# Patient Record
Sex: Female | Born: 1995 | Hispanic: Yes | Smoking: Current every day smoker
Health system: Southern US, Community
[De-identification: ages and names within clinical notes are randomized; demographics above are authoritative.]

---

## 2020-07-31 ENCOUNTER — Other Ambulatory Visit: Payer: Self-pay

## 2020-07-31 ENCOUNTER — Encounter: Payer: Self-pay | Admitting: Physician Assistant

## 2020-07-31 ENCOUNTER — Emergency Department
Admission: EM | Admit: 2020-07-31 | Discharge: 2020-07-31 | Disposition: A | Discharge status: Home / Self Care | Payer: Medicaid Other | Attending: Emergency Medicine | Admitting: Emergency Medicine

## 2020-07-31 DIAGNOSIS — M79674 Pain in right toe(s): Secondary | ICD-10-CM | POA: Diagnosis present

## 2020-07-31 DIAGNOSIS — L600 Ingrowing nail: Secondary | ICD-10-CM | POA: Insufficient documentation

## 2020-07-31 DIAGNOSIS — L089 Local infection of the skin and subcutaneous tissue, unspecified: Secondary | ICD-10-CM | POA: Diagnosis not present

## 2020-07-31 MED ORDER — CEPHALEXIN 500 MG PO CAPS: 500.0000 mg | ORAL_CAPSULE | Freq: Three times a day (TID) | ORAL | 0 refills | Status: DC

## 2020-07-31 NOTE — ED Triage Notes (Signed)
Pt comes pov with ingrown toenail with some redness and pain on right big toe.

## 2020-07-31 NOTE — Discharge Instructions (Addendum)
Soak your foot in warm salt water 3 times daily.  Return if worsening.  Take the antibiotic as prescribed.  Follow-up with podiatry.  Phone number is attached

## 2020-07-31 NOTE — ED Provider Notes (Signed)
[facility] Provider Note  ____________________________________________   Event Date/Time   First MD Initiated Contact with Patient 07/31/20 1556     (approximate)  I have reviewed the triage vital signs and the nursing notes.   HISTORY  Chief Complaint Toe Pain    HPI Maria [facility] is a 25 y.o. female presents emergency department complaining of right great toenail pain and infection.  States she tried to remove a portion of an ingrown toenail and the area has become very red and swollen.  No fever or chills.    History reviewed. No pertinent past medical history.  There are no problems to display for this patient.   History reviewed. No pertinent surgical history.  Prior to Admission medications   Medication Sig Start Date End Date Taking? Authorizing Provider  cephALEXin (KEFLEX) 500 MG capsule Take 1 capsule (500 mg total) by mouth 3 (three) times daily. 07/31/20  Yes Faythe Ghee, PA-C    Allergies Patient has no known allergies.  History reviewed. No pertinent family history.  Social History    Review of Systems  Constitutional: No fever/chills Eyes: No visual changes. ENT: No sore throat. Respiratory: Denies cough Cardiovascular: Denies chest pain Gastrointestinal: Denies abdominal pain Genitourinary: Negative for dysuria. Musculoskeletal: Negative for back pain.  Positive for right great toe pain Skin: Negative for rash. Psychiatric: no mood changes,     ____________________________________________   PHYSICAL EXAM:  VITAL SIGNS: ED Triage Vitals [07/31/20 1559]  Enc Vitals Group     BP 105/64     Pulse Rate 81     Resp 18     Temp 98.2 F (36.8 C)     Temp Source Oral     SpO2 98 %     Weight 170 lb (77.1 kg)     Height 5\' 3"  (1.6 m)     Head Circumference      Peak Flow      Pain Score 8     Pain Loc      Pain Edu?      Excl. in [place]?     Constitutional: Alert and oriented. Well  appearing and in no acute distress. Eyes: Conjunctivae are normal.  Head: Atraumatic. Nose: No congestion/rhinnorhea. Mouth/Throat: Mucous membranes are moist.   Neck:  supple no lymphadenopathy noted Cardiovascular: Normal rate, regular rhythm.  Respiratory: Normal respiratory effort.  No retractions GU: deferred Musculoskeletal: FROM all extremities, warm and well perfused, right great toe has a area of abraded skin, toe is tender and swollen, no obvious drainage noted Neurologic:  Normal speech and language.  Skin:  Skin is warm, dry  No rash noted. Psychiatric: Mood and affect are normal. Speech and behavior are normal.  ____________________________________________   LABS (all labs ordered are listed, but only abnormal results are displayed)  Labs Reviewed - No data to display ____________________________________________   ____________________________________________  RADIOLOGY    ____________________________________________   PROCEDURES  Procedure(s) performed: No  Procedures    ____________________________________________   INITIAL IMPRESSION / ASSESSMENT AND PLAN / ED COURSE  Pertinent labs & imaging results that were available during my care of the patient were reviewed by me and considered in my medical decision making (see chart for details).   Patient presents with ingrown toenail.  See HPI.  Physical exam shows area to be infected.  Explained to the patient we do not remove ingrown toenails here in the ED.  We gave her an antibiotic, explained how to  do warm water soaks, and follow-up with podiatry.  She was discharged in stable condition.     [facility] was evaluated in Emergency Department on 07/31/2020 for the symptoms described in the history of present illness. She was evaluated in the context of the global COVID-19 pandemic, which necessitated consideration that the patient might be at risk for infection with the SARS-CoV-2 virus that causes  COVID-19. Institutional protocols and algorithms that pertain to the evaluation of patients at risk for COVID-19 are in a state of rapid change based on information released by regulatory bodies including the CDC and federal and state organizations. These policies and algorithms were followed during the patient's care in the ED.    As part of my medical decision making, I reviewed the following data within the electronic MEDICAL RECORD NUMBER Nursing notes reviewed and incorporated, Old chart reviewed, Notes from prior ED visits and  Controlled Substance Database  ____________________________________________   FINAL CLINICAL IMPRESSION(S) / ED DIAGNOSES  Final diagnoses:  Ingrown toenail of right foot with infection      NEW MEDICATIONS STARTED DURING THIS VISIT:  New Prescriptions   CEPHALEXIN (KEFLEX) 500 MG CAPSULE    Take 1 capsule (500 mg total) by mouth 3 (three) times daily.     Note:  This document was prepared using Dragon voice recognition software and may include unintentional dictation errors.    Faythe Ghee, PA-C 07/31/20 1619    Phineas Semen, MD 07/31/20 [phone]

## 2020-07-31 NOTE — ED Notes (Signed)
See triage note. Pt denies fever. R foot's great toe has skin cut away medially as well as portions of the nail. Pt reports history of ingrown toe nails and states couldn't achieve pain relief after cutting the nail this time. States usually it relieves the pain. Toe red but not obviously swollen. Pt can move toe.

## 2020-12-21 ENCOUNTER — Ambulatory Visit (LOCAL_COMMUNITY_HEALTH_CENTER): Payer: Self-pay

## 2020-12-21 ENCOUNTER — Other Ambulatory Visit: Payer: Self-pay

## 2020-12-21 DIAGNOSIS — Z111 Encounter for screening for respiratory tuberculosis: Secondary | ICD-10-CM

## 2020-12-24 ENCOUNTER — Other Ambulatory Visit: Payer: Self-pay

## 2020-12-24 ENCOUNTER — Ambulatory Visit (LOCAL_COMMUNITY_HEALTH_CENTER): Payer: Medicaid Other

## 2020-12-24 ENCOUNTER — Other Ambulatory Visit: Payer: Medicaid Other

## 2020-12-24 DIAGNOSIS — Z111 Encounter for screening for respiratory tuberculosis: Secondary | ICD-10-CM

## 2020-12-24 LAB — TB SKIN TEST
Induration: 0 mm
TB Skin Test: NEGATIVE

## 2021-02-11 ENCOUNTER — Emergency Department: Payer: Medicaid Other

## 2021-02-11 ENCOUNTER — Emergency Department
Admission: EM | Admit: 2021-02-11 | Discharge: 2021-02-11 | Disposition: A | Discharge status: Home / Self Care | Payer: Medicaid Other | Attending: Emergency Medicine | Admitting: Emergency Medicine

## 2021-02-11 DIAGNOSIS — R059 Cough, unspecified: Secondary | ICD-10-CM | POA: Insufficient documentation

## 2021-02-11 DIAGNOSIS — J029 Acute pharyngitis, unspecified: Secondary | ICD-10-CM | POA: Insufficient documentation

## 2021-02-11 DIAGNOSIS — Z20822 Contact with and (suspected) exposure to covid-19: Secondary | ICD-10-CM | POA: Insufficient documentation

## 2021-02-11 DIAGNOSIS — R051 Acute cough: Secondary | ICD-10-CM

## 2021-02-11 LAB — RESP PANEL BY RT-PCR (FLU A&B, COVID) ARPGX2
Influenza A by PCR: NEGATIVE
Influenza B by PCR: NEGATIVE
SARS Coronavirus 2 by RT PCR: NEGATIVE

## 2021-02-11 LAB — GROUP A STREP BY PCR: Group A Strep by PCR: NOT DETECTED

## 2021-02-11 LAB — HCG, QUANTITATIVE, PREGNANCY: hCG, Beta Chain, Quant, S: 274 m[IU]/mL — ABNORMAL HIGH (ref <5)

## 2021-02-11 MED ORDER — DEXAMETHASONE 6 MG PO TABS
10.0000 mg | ORAL_TABLET | Freq: Once | ORAL | Status: AC
  Administered 2021-02-11: 10 mg via ORAL
  Filled 2021-02-11: qty 1

## 2021-02-11 MED ORDER — ALBUTEROL SULFATE HFA 108 (90 BASE) MCG/ACT IN AERS
2.0000 | INHALATION_SPRAY | Freq: Four times a day (QID) | RESPIRATORY_TRACT | 2 refills | Status: DC | PRN
Start: 2021-02-11 — End: 2021-02-11

## 2021-02-11 MED ORDER — ALBUTEROL SULFATE HFA 108 (90 BASE) MCG/ACT IN AERS: 2.0000 | INHALATION_SPRAY | Freq: Four times a day (QID) | RESPIRATORY_TRACT | 2 refills | Status: AC | PRN

## 2021-02-11 NOTE — ED Provider Notes (Signed)
Emergency Medicine Provider Triage Evaluation Note  Maria Terry , a 25 y.o. female  was evaluated in triage.  Pt complains of complains of URI symptoms, cough and body aches, no fever.  Patient also took a abortion pill last week.  States she felt like it got stuck in her throat and is concerned that may have caused her symptoms.  Review of Systems  Positive: Cough, body aches, sore Negative: Fever chills chest pain shortness of breath  Physical Exam  BP 104/77 (BP Location: Left Arm)   Pulse 92   Temp 98.3 F (36.8 C) (Oral)   Resp 18   Ht 5\' 3"  (1.6 m)   Wt 81.6 kg   SpO2 98%   BMI 31.89 kg/m  Gen:   Awake, no distress   Resp:  Normal effort  MSK:   Moves extremities without difficulty  Other:    Medical Decision Making  Medically screening exam initiated at 9:17 AM.  Appropriate orders placed.  [facility] was informed that the remainder of the evaluation will be completed by another provider, this initial triage assessment does not replace that evaluation, and the importance of remaining in the ED until their evaluation is complete.  Labs and imaging ordered.  We will also do beta-hCG to to patient's recent miscarriage.   Angelique Holm, PA-C 02/11/21 02/13/21    [id number], MD 02/11/21 [phone]

## 2021-02-11 NOTE — ED Triage Notes (Signed)
Pt here with a cough and a sore throat since Sunday. Pt states the sore throat began yesterday and it is painful to swallow. Pt in NAD in triage.

## 2021-02-11 NOTE — ED Notes (Signed)
Dc instructions and scripts reviewed with pt no questions or concerns at this time. Will follow up as needed. Ambulated to lobby without difficulty.

## 2021-02-11 NOTE — ED Provider Notes (Signed)
[facility] Emergency Department Provider Note    Event Date/Time   First MD Initiated Contact with Patient 02/11/21 1103     (approximate)  I have reviewed the triage vital signs and the nursing notes.   HISTORY  Chief Complaint Cough and Sore Throat    HPI Maria Terry is a 25 y.o. female history of vaping and recent medical termination of pregnancy presents to the ER for cough congestion sore throat last 12 to 24 hours.  No nausea or vomiting.  States she does have allergies and feels a little bit different.  Has had some congestion as well.  Took a home COVID test that was negative.  She denies any pelvic pain or discharge.  She has follow-up with OB next week for repeat hCG level.  States she otherwise feels well.  No past medical history on file. No family history on file. No past surgical history on file. There are no problems to display for this patient.     Prior to Admission medications   Medication Sig Start Date End Date Taking? Authorizing Provider  albuterol (VENTOLIN HFA) 108 (90 Base) MCG/ACT inhaler Inhale 2 puffs into the lungs every 6 (six) hours as needed for wheezing or shortness of breath. 02/11/21  Yes Willy Eddy, MD  cephALEXin (KEFLEX) 500 MG capsule Take 1 capsule (500 mg total) by mouth 3 (three) times daily. Patient not taking: Reported on 12/21/2020 07/31/20   Faythe Ghee, PA-C    Allergies Patient has no known allergies.    Social History    Review of Systems Patient denies headaches, rhinorrhea, blurry vision, numbness, shortness of breath, chest pain, edema, cough, abdominal pain, nausea, vomiting, diarrhea, dysuria, fevers, rashes or hallucinations unless otherwise stated above in HPI. ____________________________________________   PHYSICAL EXAM:  VITAL SIGNS: Vitals:   02/11/21 0915  BP: 104/77  Pulse: 92  Resp: 18  Temp: 98.3 F (36.8 C)  SpO2: 98%    Constitutional: Alert and oriented.   Eyes: Conjunctivae are normal.  Head: Atraumatic. Nose: No congestion/rhinnorhea. Mouth/Throat: Mucous membranes are moist.  Bilateral tonsillar erythema uvula is midline.  No exudates.  No stridor. Neck: No stridor. Painless ROM.  Cardiovascular: Normal rate, regular rhythm. Grossly normal heart sounds.  Good peripheral circulation. Respiratory: Normal respiratory effort.  No retractions. Lungs CTAB. Gastrointestinal: Soft and nontender. No distention. No abdominal bruits. No CVA tenderness. Genitourinary:  Musculoskeletal: No lower extremity tenderness nor edema.  No joint effusions. Neurologic:  Normal speech and language. No gross focal neurologic deficits are appreciated. No facial droop Skin:  Skin is warm, dry and intact. No rash noted. Psychiatric: Mood and affect are normal. Speech and behavior are normal.  ____________________________________________   LABS (all labs ordered are listed, but only abnormal results are displayed)  Results for orders placed or performed during the hospital encounter of 02/11/21 (from the past 24 hour(s))  Resp Panel by RT-PCR (Flu A&B, Covid) Nasopharyngeal Swab     Status: None   Collection Time: 02/11/21  9:20 AM   Specimen: Nasopharyngeal Swab; Nasopharyngeal(NP) swabs in vial transport medium  Result Value Ref Range   SARS Coronavirus 2 by RT PCR NEGATIVE NEGATIVE   Influenza A by PCR NEGATIVE NEGATIVE   Influenza B by PCR NEGATIVE NEGATIVE  hCG, quantitative, pregnancy     Status: Abnormal   Collection Time: 02/11/21  9:20 AM  Result Value Ref Range   hCG, Beta Chain, Quant, S 274 (H) <5 mIU/mL   ____________________________________________  ____________________________________________  RADIOLOGY  I personally reviewed all radiographic images ordered to evaluate for the above acute complaints and reviewed radiology reports and findings.  These findings were personally discussed with the patient.  Please see medical record for  radiology report.  ____________________________________________   PROCEDURES  Procedure(s) performed:  Procedures    Critical Care performed: no ____________________________________________   INITIAL IMPRESSION / ASSESSMENT AND PLAN / ED COURSE  Pertinent labs & imaging results that were available during my care of the patient were reviewed by me and considered in my medical decision making (see chart for details).   DDX: uri, strep, coid, rsv, pna, ptx  Maria [facility] is a 25 y.o. who presents to the ED with presentation as described above.  Patient clinically very well-appearing no acute distress.  Has a very bronchitic sounding cough with some coarse breath sounds no sign of pneumonia or pneumothorax.  Is not consistent with PE or cardiac etiology.  hCG ordered at triage is elevated but likely downtrending given recent medical AB.  She does not have any symptoms to suggest a failed termination of pregnancy at this time no signs or symptoms suggest ectopic.  She does appear clinically stable and appropriate for outpatient follow-up with OB.  Will be given albuterol as well as Decadron.  Will test for strep.  Does appear appropriate for outpatient follow-up.     The patient was evaluated in Emergency Department today for the symptoms described in the history of present illness. He/she was evaluated in the context of the global COVID-19 pandemic, which necessitated consideration that the patient might be at risk for infection with the SARS-CoV-2 virus that causes COVID-19. Institutional protocols and algorithms that pertain to the evaluation of patients at risk for COVID-19 are in a state of rapid change based on information released by regulatory bodies including the CDC and federal and state organizations. These policies and algorithms were followed during the patient's care in the ED.  As part of my medical decision making, I reviewed the following data within the electronic medical  record:  Nursing notes reviewed and incorporated, Labs reviewed, notes from prior ED visits and [place] Controlled Substance Database   ____________________________________________   FINAL CLINICAL IMPRESSION(S) / ED DIAGNOSES  Final diagnoses:  Sore throat  Acute cough      NEW MEDICATIONS STARTED DURING THIS VISIT:  New Prescriptions   ALBUTEROL (VENTOLIN HFA) 108 (90 BASE) MCG/ACT INHALER    Inhale 2 puffs into the lungs every 6 (six) hours as needed for wheezing or shortness of breath.     Note:  This document was prepared using Dragon voice recognition software and may include unintentional dictation errors.    Willy Eddy, MD 02/11/21 [phone]

## 2021-05-05 ENCOUNTER — Other Ambulatory Visit: Payer: Self-pay

## 2021-05-05 ENCOUNTER — Emergency Department: Payer: Medicaid Other

## 2021-05-05 ENCOUNTER — Encounter: Payer: Self-pay | Admitting: *Deleted

## 2021-05-05 DIAGNOSIS — R059 Cough, unspecified: Secondary | ICD-10-CM | POA: Diagnosis not present

## 2021-05-05 DIAGNOSIS — R509 Fever, unspecified: Secondary | ICD-10-CM | POA: Diagnosis not present

## 2021-05-05 DIAGNOSIS — R5381 Other malaise: Secondary | ICD-10-CM | POA: Insufficient documentation

## 2021-05-05 DIAGNOSIS — Z20822 Contact with and (suspected) exposure to covid-19: Secondary | ICD-10-CM | POA: Insufficient documentation

## 2021-05-05 DIAGNOSIS — R000 Tachycardia, unspecified: Secondary | ICD-10-CM | POA: Diagnosis not present

## 2021-05-05 DIAGNOSIS — R0602 Shortness of breath: Secondary | ICD-10-CM | POA: Insufficient documentation

## 2021-05-05 DIAGNOSIS — R0789 Other chest pain: Secondary | ICD-10-CM | POA: Insufficient documentation

## 2021-05-05 LAB — BASIC METABOLIC PANEL
Anion gap: 6 (ref 5–15)
BUN: 12 mg/dL (ref 6–20)
CO2: 20 mmol/L — ABNORMAL LOW (ref 22–32)
Calcium: 8.9 mg/dL (ref 8.9–10.3)
Chloride: 108 mmol/L (ref 98–111)
Creatinine, Ser: 0.5 mg/dL (ref 0.44–1.00)
GFR, Estimated: >60 mL/min (ref >60)
Glucose, Bld: 127 mg/dL — ABNORMAL HIGH (ref 70–99)
Potassium: 3.4 mmol/L — ABNORMAL LOW (ref 3.5–5.1)
Sodium: 134 mmol/L — ABNORMAL LOW (ref 135–145)

## 2021-05-05 LAB — CBC
HCT: 42.1 % (ref 36.0–46.0)
Hemoglobin: 13.9 g/dL (ref 12.0–15.0)
MCH: 25.5 pg — ABNORMAL LOW (ref 26.0–34.0)
MCHC: 33 g/dL (ref 30.0–36.0)
MCV: 77.1 fL — ABNORMAL LOW (ref 80.0–100.0)
Platelets: 233 10*3/uL (ref 150–400)
RBC: 5.46 MIL/uL — ABNORMAL HIGH (ref 3.87–5.11)
RDW: 14.1 % (ref 11.5–15.5)
WBC: 8.7 10*3/uL (ref 4.0–10.5)
nRBC: 0 % (ref 0.0–0.2)

## 2021-05-05 LAB — TROPONIN I (HIGH SENSITIVITY): Troponin I (High Sensitivity): <2 ng/L (ref <18)

## 2021-05-05 NOTE — ED Triage Notes (Signed)
Pt reports sob x 1 hour.  Pt also has chest pain radiating into the back   pt alert  speech clear. Cig smoker.

## 2021-05-06 ENCOUNTER — Emergency Department
Admission: EM | Admit: 2021-05-06 | Discharge: 2021-05-06 | Disposition: A | Discharge status: Home / Self Care | Payer: Medicaid Other | Attending: Emergency Medicine | Admitting: Emergency Medicine

## 2021-05-06 DIAGNOSIS — B349 Viral infection, unspecified: Secondary | ICD-10-CM

## 2021-05-06 DIAGNOSIS — R0600 Dyspnea, unspecified: Secondary | ICD-10-CM

## 2021-05-06 DIAGNOSIS — R509 Fever, unspecified: Secondary | ICD-10-CM

## 2021-05-06 LAB — HEPATIC FUNCTION PANEL
ALT: 15 U/L (ref 0–44)
AST: 14 U/L — ABNORMAL LOW (ref 15–41)
Albumin: 4 g/dL (ref 3.5–5.0)
Alkaline Phosphatase: 87 U/L (ref 38–126)
Bilirubin, Direct: 0.1 mg/dL (ref 0.0–0.2)
Indirect Bilirubin: 0.7 mg/dL (ref 0.3–0.9)
Total Bilirubin: 0.8 mg/dL (ref 0.3–1.2)
Total Protein: 7.4 g/dL (ref 6.5–8.1)

## 2021-05-06 LAB — RESP PANEL BY RT-PCR (FLU A&B, COVID) ARPGX2
Influenza A by PCR: NEGATIVE
Influenza B by PCR: NEGATIVE
SARS Coronavirus 2 by RT PCR: NEGATIVE

## 2021-05-06 LAB — PROTIME-INR
INR: 1 (ref 0.8–1.2)
Prothrombin Time: 13.4 seconds (ref 11.4–15.2)

## 2021-05-06 LAB — LACTIC ACID, PLASMA: Lactic Acid, Venous: 1 mmol/L (ref 0.5–1.9)

## 2021-05-06 LAB — D-DIMER, QUANTITATIVE: D-Dimer, Quant: 0.32 ug/mL-FEU (ref 0.00–0.50)

## 2021-05-06 LAB — TROPONIN I (HIGH SENSITIVITY): Troponin I (High Sensitivity): <2 ng/L (ref <18)

## 2021-05-06 MED ORDER — ONDANSETRON 4 MG PO TBDP
4.0000 mg | ORAL_TABLET | Freq: Three times a day (TID) | ORAL | 0 refills | Status: AC | PRN
Start: 2021-05-06 — End: ?

## 2021-05-06 MED ORDER — LACTATED RINGERS IV BOLUS
1000.0000 mL | Freq: Once | INTRAVENOUS | Status: AC
  Administered 2021-05-06: 1000 mL via INTRAVENOUS

## 2021-05-06 NOTE — ED Provider Notes (Signed)
°  Patient received in signout from Dr. Karma Greaser pending remainder of blood work and IV fluids for evaluation of shortness of breath and possible sepsis.  Work-up returns quite benign without leukocytosis, lactic acidosis or signs of sepsis.  D-dimer is negative and PE is less likely.  2 troponins are negative and patient reports feeling much better after IV fluids on my reassessment.  Resolution of tachycardia after IV fluids as well.  I provided a prescription for Zofran to use as needed at home, we discussed OTC antipyretics and return precautions for the ED.  Patient suitable for outpatient management.   Vladimir Crofts, MD 05/06/21 [phone]

## 2021-05-06 NOTE — ED Provider Notes (Signed)
[facility] Provider Note    Event Date/Time   First MD Initiated Contact with Patient 05/06/21 [phone]     (approximate)   History   Shortness of Breath   HPI  [facility] is a 26 y.o. female who reports no chronic medical issues and presents for evaluation of less than 12 hours of shortness of breath, mild cough, general malaise, and some chest pressure.  Symptoms were acute in onset and she has not felt them before.  She did not realize she was febrile until coming to the emergency department.  No nausea or vomiting.  Good oral intake recently.  No history of blood clots in her legs or her lungs.  No history of heart disease.  She denies nasal congestion, sore throat, neck pain, neck stiffness.  Some mild headache.  No abdominal pain, no dysuria.     Physical Exam   Triage Vital Signs: ED Triage Vitals  Enc Vitals Group     BP 05/05/21 2136 118/79     Pulse Rate 05/05/21 2136 (!) 122     Resp 05/05/21 2136 20     Temp 05/05/21 2136 (!) 100.6 F (38.1 C)     Temp Source 05/05/21 2136 Oral     SpO2 05/05/21 2136 94 %     Weight 05/05/21 2136 81.6 kg (180 lb)     Height 05/05/21 2136 1.6 m (5\' 3" )     Head Circumference --      Peak Flow --      Pain Score 05/05/21 2143 10     Pain Loc --      Pain Edu? --      Excl. in [place]? --     Most recent vital signs: Vitals:   05/05/21 2136 05/06/21 0032  BP: 118/79 134/76  Pulse: (!) 122 (!) 124  Resp: 20 18  Temp: (!) 100.6 F (38.1 C) 98.8 F (37.1 C)  SpO2: 94% 95%     General: Awake, no distress but appears uncomfortable. CV:  Good peripheral perfusion.  Normal capillary refill.  Tachycardia with heart rate in the 120s. Resp:  Normal effort.  No tachypnea.  No wheezes, rales, nor rhonchi upon auscultation. Abd:  No distention.  No tenderness to palpation.   ED Results / Procedures / Treatments   Labs (all labs ordered are listed, but only abnormal results are displayed) Labs Reviewed   BASIC METABOLIC PANEL - Abnormal; Notable for the following components:      Result Value   Sodium 134 (*)    Potassium 3.4 (*)    CO2 20 (*)    Glucose, Bld 127 (*)    All other components within normal limits  CBC - Abnormal; Notable for the following components:   RBC 5.46 (*)    MCV 77.1 (*)    MCH 25.5 (*)    All other components within normal limits  RESP PANEL BY RT-PCR (FLU A&B, COVID) ARPGX2  CULTURE, BLOOD (ROUTINE X 2)  CULTURE, BLOOD (ROUTINE X 2)  URINE CULTURE  LACTIC ACID, PLASMA  LACTIC ACID, PLASMA  PROTIME-INR  URINALYSIS, COMPLETE (UACMP) WITH MICROSCOPIC  HEPATIC FUNCTION PANEL  POC URINE PREG, ED  POC URINE PREG, ED  TROPONIN I (HIGH SENSITIVITY)  TROPONIN I (HIGH SENSITIVITY)     EKG  ED ECG REPORT I, Hinda Kehr, the attending physician, personally viewed and interpreted this ECG.  Date: 05/05/2021 EKG Time: 21: 40 Rate: 122 Rhythm: Sinus tachycardia QRS Axis:  normal Intervals: normal ST/T Wave abnormalities: Non-specific ST segment / T-wave changes, but no clear evidence of acute ischemia. Narrative Interpretation: no definitive evidence of acute ischemia; does not meet STEMI criteria.    RADIOLOGY I personally reviewed the patient's chest x-ray and see no evidence of lobar pneumonia.  Radiology confirms this impression.    PROCEDURES:  Critical Care performed: No  .1-3 Lead EKG Interpretation Performed by: Hinda Kehr, MD Authorized by: Hinda Kehr, MD     Interpretation: abnormal     ECG rate:  120   ECG rate assessment: tachycardic     Rhythm: sinus tachycardia     Ectopy: none     Conduction: normal     MEDICATIONS ORDERED IN ED: Medications  lactated ringers bolus 1,000 mL (has no administration in time range)     IMPRESSION / MDM / ASSESSMENT AND PLAN / ED COURSE  I reviewed the triage vital signs and the nursing notes.                              Differential diagnosis includes, but is not limited to,  respiratory viral illness including COVID-19 and influenza, may require pneumonia, PE, ACS, other nonspecific infectious source.  The patient is on the cardiac monitor to evaluate for evidence of arrhythmia and/or significant heart rate changes.  Vital signs are notable for persistent tachycardia in the 120s.  She was also febrile at 100.6 upon arrival.  Labs ordered when the patient was triaged include basic metabolic panel and CBC.  The CBC is within normal limits with no leukocytosis and basic metabolic panel was reassuring with no clinically significant electrolyte or renal abnormalities.  However the patient is febrile and tachycardic.  This likely is a viral illness rather than a bacterial infection but I will initiate "possible sepsis" work-up and I ordered lactic acid, respiratory viral panel, blood cultures, and hepatic function panel.  I also ordered 1 L LR fluid bolus to assess for improvements in heart rate.  I reviewed the chest x-ray and see no evidence of lobar pneumonia.  Radiologist agrees with plan.  Patient understands and agrees with the plan.  We will reassess after additional work-up and fluids to assess for improvement.  Clinical Course as of 05/06/21 0805  Fri May 06, 2021  0734 Transferring ED care to Dr. Vladimir Crofts to reassess after lab results, fluids. [CF]    Clinical Course User Index [CF] Hinda Kehr, MD     FINAL CLINICAL IMPRESSION(S) / ED DIAGNOSES   Final diagnoses:  Dyspnea, unspecified type  Fever, unspecified fever cause     Rx / DC Orders   ED Discharge Orders     None        Note:  This document was prepared using Dragon voice recognition software and may include unintentional dictation errors.   Hinda Kehr, MD 05/06/21 [phone]

## 2021-05-06 NOTE — ED Notes (Signed)
EDP at bedside  

## 2021-05-06 NOTE — Discharge Instructions (Signed)
Please take Tylenol and ibuprofen/Advil for your pain.  It is safe to take them together, or to alternate them every few hours.  Take up to 1000mg  of Tylenol at a time, up to 4 times per day.  Do not take more than 4000 mg of Tylenol in 24 hours.  For ibuprofen, take 400-600 mg, 4-5 times per day.  You Zofran as needed for nausea and vomiting.

## 2021-05-06 NOTE — ED Notes (Signed)
Pt resting with RR even and unlabored. Call bell in reach. Stretcher locked in lowest position. NAD noted

## 2021-05-14 LAB — CULTURE, BLOOD (ROUTINE X 2)
Culture: NO GROWTH
Culture: NO GROWTH

## 2021-07-04 ENCOUNTER — Emergency Department
Admission: EM | Admit: 2021-07-04 | Discharge: 2021-07-04 | Disposition: A | Discharge status: Home / Self Care | Payer: Medicaid Other | Attending: Emergency Medicine | Admitting: Emergency Medicine

## 2021-07-04 ENCOUNTER — Emergency Department: Payer: Medicaid Other

## 2021-07-04 ENCOUNTER — Encounter: Payer: Self-pay | Admitting: Emergency Medicine

## 2021-07-04 ENCOUNTER — Other Ambulatory Visit: Payer: Self-pay

## 2021-07-04 DIAGNOSIS — R630 Anorexia: Secondary | ICD-10-CM | POA: Diagnosis not present

## 2021-07-04 DIAGNOSIS — R110 Nausea: Secondary | ICD-10-CM | POA: Insufficient documentation

## 2021-07-04 DIAGNOSIS — R197 Diarrhea, unspecified: Secondary | ICD-10-CM | POA: Insufficient documentation

## 2021-07-04 DIAGNOSIS — K55069 Acute infarction of intestine, part and extent unspecified: Secondary | ICD-10-CM

## 2021-07-04 DIAGNOSIS — R1031 Right lower quadrant pain: Secondary | ICD-10-CM | POA: Diagnosis not present

## 2021-07-04 LAB — COMPREHENSIVE METABOLIC PANEL
ALT: 22 U/L (ref 0–44)
AST: 18 U/L (ref 15–41)
Albumin: 4.1 g/dL (ref 3.5–5.0)
Alkaline Phosphatase: 91 U/L (ref 38–126)
Anion gap: 8 (ref 5–15)
BUN: 11 mg/dL (ref 6–20)
CO2: 25 mmol/L (ref 22–32)
Calcium: 9.3 mg/dL (ref 8.9–10.3)
Chloride: 106 mmol/L (ref 98–111)
Creatinine, Ser: 0.55 mg/dL (ref 0.44–1.00)
GFR, Estimated: >60 mL/min (ref >60)
Glucose, Bld: 93 mg/dL (ref 70–99)
Potassium: 3.6 mmol/L (ref 3.5–5.1)
Sodium: 139 mmol/L (ref 135–145)
Total Bilirubin: 0.6 mg/dL (ref 0.3–1.2)
Total Protein: 7.6 g/dL (ref 6.5–8.1)

## 2021-07-04 LAB — URINALYSIS, ROUTINE W REFLEX MICROSCOPIC
Bacteria, UA: NONE SEEN
Bilirubin Urine: NEGATIVE
Glucose, UA: NEGATIVE mg/dL
Ketones, ur: NEGATIVE mg/dL
Leukocytes,Ua: NEGATIVE
Nitrite: NEGATIVE
Protein, ur: NEGATIVE mg/dL
Specific Gravity, Urine: 1.026 (ref 1.005–1.030)
pH: 5 (ref 5.0–8.0)

## 2021-07-04 LAB — CBC
HCT: 40.4 % (ref 36.0–46.0)
Hemoglobin: 12.7 g/dL (ref 12.0–15.0)
MCH: 24.2 pg — ABNORMAL LOW (ref 26.0–34.0)
MCHC: 31.4 g/dL (ref 30.0–36.0)
MCV: 77.1 fL — ABNORMAL LOW (ref 80.0–100.0)
Platelets: 247 10*3/uL (ref 150–400)
RBC: 5.24 MIL/uL — ABNORMAL HIGH (ref 3.87–5.11)
RDW: 14.7 % (ref 11.5–15.5)
WBC: 5.6 10*3/uL (ref 4.0–10.5)
nRBC: 0 % (ref 0.0–0.2)

## 2021-07-04 LAB — LIPASE, BLOOD: Lipase: 39 U/L (ref 11–51)

## 2021-07-04 LAB — POC URINE PREG, ED: Preg Test, Ur: NEGATIVE

## 2021-07-04 MED ORDER — ONDANSETRON HCL 4 MG/2ML IJ SOLN
4.0000 mg | Freq: Once | INTRAMUSCULAR | Status: AC
  Administered 2021-07-04: 4 mg via INTRAVENOUS
  Filled 2021-07-04: qty 2

## 2021-07-04 MED ORDER — FENTANYL CITRATE PF 50 MCG/ML IJ SOSY
50.0000 ug | PREFILLED_SYRINGE | Freq: Once | INTRAMUSCULAR | Status: AC
  Administered 2021-07-04: 50 ug via INTRAVENOUS
  Filled 2021-07-04: qty 1

## 2021-07-04 MED ORDER — IOHEXOL 300 MG/ML  SOLN
100.0000 mL | Freq: Once | INTRAMUSCULAR | Status: AC | PRN
  Administered 2021-07-04: 100 mL via INTRAVENOUS
  Filled 2021-07-04: qty 100

## 2021-07-04 MED ORDER — SODIUM CHLORIDE 0.9 % IV BOLUS
1000.0000 mL | Freq: Once | INTRAVENOUS | Status: AC
  Administered 2021-07-04: 1000 mL via INTRAVENOUS

## 2021-07-04 MED ORDER — NAPROXEN 500 MG PO TABS: 500.0000 mg | ORAL_TABLET | Freq: Two times a day (BID) | ORAL | 2 refills | Status: AC

## 2021-07-04 MED ORDER — OXYCODONE-ACETAMINOPHEN 5-325 MG PO TABS: 1.0000 | ORAL_TABLET | Freq: Three times a day (TID) | ORAL | 0 refills | Status: AC | PRN

## 2021-07-04 NOTE — ED Notes (Signed)
Urine preg NEG. 

## 2021-07-04 NOTE — ED Notes (Signed)
Stretcher locked low. Rail up. Call bell within reach. Pt's child at bedside.  ?

## 2021-07-04 NOTE — ED Notes (Addendum)
Pt up to restroom. Ambulatory; steady.  ?

## 2021-07-04 NOTE — ED Provider Notes (Addendum)
? ?[facility] ?Provider Note ? ? ? Event Date/Time  ? First MD Initiated Contact with Patient 07/04/21 1106   ?  (approximate) ? ? ?History  ? ?Chief Complaint ?Abdominal Pain ? ? ?HPI ?Maria Terry is a 26 y.o. female, history of asthma, presents to the emergency department for evaluation of abdominal pain.  Patient states that the pain has been going on for the past 2 days, described as sharp abdominal pain in her right lower quadrant, constant, 7/10, hurts worse with laughter or movement.  Additionally endorses decreased appetite, 3 episodes of diarrhea, and nausea.  Denies fever/chills, chest pain, shortness of breath, urinary symptoms, headache, vomiting, vaginal discharge, or rashes/lesions. ? ?History Limitations: No limitations. ? ?  ? ? ?Physical Exam  ?Triage Vital Signs: ?ED Triage Vitals  ?Enc Vitals Group  ?   BP 07/04/21 1047 (!) 111/92  ?   Pulse Rate 07/04/21 1047 72  ?   Resp 07/04/21 1047 18  ?   Temp 07/04/21 1047 97.7 ?F (36.5 ?C)  ?   Temp Source 07/04/21 1047 Oral  ?   SpO2 07/04/21 1047 100 %  ?   Weight 07/04/21 1055 180 lb (81.6 kg)  ?   Height 07/04/21 1055 5\' 3"  (1.6 m)  ?   Head Circumference --   ?   Peak Flow --   ?   Pain Score 07/04/21 1055 10  ?   Pain Loc --   ?   Pain Edu? --   ?   Excl. in GC? --   ? ? ?Most recent vital signs: ?Vitals:  ? 07/04/21 1137 07/04/21 1218  ?BP:  99/63  ?Pulse: 64 68  ?Resp:  18  ?Temp:    ?SpO2:  100%  ? ? ?General: Awake, appears uncomfortable. ?Skin: Warm, dry.  ?CV: Good peripheral perfusion.  ?Resp: Normal effort.  ?Abd: Soft, no distention.  Significant tenderness when palpating the right lower quadrant and umbilicus region.  Patient endorses CVA tenderness as well on both the right and left side ?Neuro: At baseline. No gross neurological deficits.  ?Other: None. ? ?Physical Exam ? ? ? ?ED Results / Procedures / Treatments  ?Labs ?(all labs ordered are listed, but only abnormal results are displayed) ?Labs Reviewed  ?CBC -  Abnormal; Notable for the following components:  ?    Result Value  ? RBC 5.24 (*)   ? MCV 77.1 (*)   ? MCH 24.2 (*)   ? All other components within normal limits  ?URINALYSIS, ROUTINE W REFLEX MICROSCOPIC - Abnormal; Notable for the following components:  ? Color, Urine YELLOW (*)   ? APPearance HAZY (*)   ? Hgb urine dipstick LARGE (*)   ? All other components within normal limits  ?LIPASE, BLOOD  ?COMPREHENSIVE METABOLIC PANEL  ?POC URINE PREG, ED  ? ? ? ?EKG ?Not applicable. ? ? ?RADIOLOGY ? ?ED Provider Interpretation: Pending abdominal/pelvic CT. ? ?CT Abdomen Pelvis W Contrast ? ?Result Date: 07/04/2021 ?CLINICAL DATA:  Right lower quadrant pain for 2 days. EXAM: CT ABDOMEN AND PELVIS WITH CONTRAST TECHNIQUE: Multidetector CT imaging of the abdomen and pelvis was performed using the standard protocol following bolus administration of intravenous contrast. RADIATION DOSE REDUCTION: This exam was performed according to the departmental dose-optimization program which includes automated exposure control, adjustment of the mA and/or kV according to patient size and/or use of iterative reconstruction technique. CONTRAST:  07/06/2021 OMNIPAQUE IOHEXOL 300 MG/ML  SOLN COMPARISON:  None. FINDINGS: Lower  Chest: No acute findings. Hepatobiliary: No hepatic masses identified. Gallbladder is unremarkable. No evidence of biliary ductal dilatation. Pancreas:  No mass or inflammatory changes. Spleen: Within normal limits in size and appearance. Adrenals/Urinary Tract: No masses identified. No evidence of ureteral calculi or hydronephrosis. Stomach/Bowel: No evidence of obstruction, bowel wall thickening, or abnormal fluid collections. Normal appendix visualized. Mild ill-defined density is seen in the omental fat adjacent to the anterolateral wall of the ascending colon, suspicious for omental infarct. Vascular/Lymphatic: No pathologically enlarged lymph nodes. No acute vascular findings. Reproductive:  No mass or other  significant abnormality. Other:  None. Musculoskeletal:  No suspicious bone lesions identified. 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 Otherwise unremarkable exam.  No evidence of appendicitis. Electronically Signed   By: Danae Orleans M.D.   On: 07/04/2021 12:56   ? ?PROCEDURES: ? ?Critical Care performed: None. ? ?Procedures ? ? ? ?MEDICATIONS ORDERED IN ED: ?Medications  ?sodium chloride 0.9 % bolus 1,000 mL (1,000 mLs Intravenous New Bag/Given 07/04/21 1133)  ?fentaNYL (SUBLIMAZE) injection 50 mcg (50 mcg Intravenous Given 07/04/21 1131)  ?ondansetron (ZOFRAN) injection 4 mg (4 mg Intravenous Given 07/04/21 1130)  ?iohexol (OMNIPAQUE) 300 MG/ML solution 100 mL (100 mLs Intravenous Contrast Given 07/04/21 1240)  ? ? ? ?IMPRESSION / MDM / ASSESSMENT AND PLAN / ED COURSE  ?I reviewed the triage vital signs and the nursing notes. ?             ?               ? ? ?Differential diagnosis includes, but is not limited to, appendicitis, ovarian torsion, diverticulitis, nephrolithiasis, pyelonephritis, cystitis, gastroenteritis, pancreatitis, cholecystitis/cholangitis. ? ?ED Course ?Patient appears uncomfortable, but stable.  Vital signs within normal limits.  We will go ahead and treat with IV fluids, fentanyl, and ondansetron.  Pregnancy test negative. ? ?CBC shows no evidence of leukocytosis or anemia.  ? ?CMP shows no electrolyte abnormalities, transaminitis, or evidence of kidney injury. ? ?Urinalysis notable for large hemoglobin, otherwise no evidence of urinary tract infection. ? ?Lipase unremarkable at 39.  Unlikely pancreatitis. ? ?Pending CT abdomen/pelvis at this time.  Patient is currently stable.  Patient care transferred over to Dr. Jene Every at 1239. ?  ? ? ?FINAL CLINICAL IMPRESSION(S) / ED DIAGNOSES  ? ?Final diagnoses:  ?None  ? ? ? ?Rx / DC Orders  ? ?ED Discharge Orders   ? ? None  ? ?  ? ? ? ?Note:  This document was prepared using Dragon voice  recognition software and may include unintentional dictation errors. ?  ?Varney Daily, [place] ?07/04/21 1240 ? ?  ?Jene Every, MD ?07/04/21 1246 ? ?  ?Jene Every, MD ?07/04/21 1303 ? ?

## 2021-07-04 NOTE — ED Notes (Signed)
See triage note. Pt tender at R side of abdomen. Reports stabbing pain started either Thursday or Friday and that she came into ER today bc it got worse. Reports son at bedside was sick within last week with abdominal discomfort and similar symptoms in addition to a HA. Resp reg/unlabored; pt calm but grimaces every one in a while while holding R arm to abdomen; skin dry. Blanket given to pt's son as requested.  ?

## 2021-07-04 NOTE — ED Triage Notes (Signed)
Pt to ED via POV with RLQ pain that hurts worse  when she laughs, moves or breaths. She was sent over from the urgent care after the MD touched her RLQ and the pain shot up to her umbilicus. She has had diarrhea and nausea without emesis. She is on her menstrual cycle. ?

## 2021-07-04 NOTE — ED Notes (Addendum)
Wrong patient. This note is not meant for this patient. ? ? ?Pt requesting to be cleaned up as she had a bowel movement on herself while vomiting. Pt provided with warm wipes to clean herself with and shown where the bathroom is. Pt ambulatory without difficulty. ?

## 2021-10-10 ENCOUNTER — Other Ambulatory Visit: Payer: Self-pay | Admitting: Student

## 2021-10-10 DIAGNOSIS — K55069 Acute infarction of intestine, part and extent unspecified: Secondary | ICD-10-CM

## 2022-02-28 ENCOUNTER — Other Ambulatory Visit: Payer: Self-pay | Admitting: Medical

## 2022-02-28 DIAGNOSIS — K6389 Other specified diseases of intestine: Secondary | ICD-10-CM

## 2022-02-28 DIAGNOSIS — N926 Irregular menstruation, unspecified: Secondary | ICD-10-CM

## 2022-03-16 ENCOUNTER — Ambulatory Visit: Admission: RE | Admit: 2022-03-16 | Payer: Medicaid Other | Source: Ambulatory Visit

## 2022-03-28 ENCOUNTER — Ambulatory Visit: Admission: RE | Admit: 2022-03-28 | Payer: Medicaid Other | Source: Ambulatory Visit

## 2022-04-06 ENCOUNTER — Ambulatory Visit: Admission: RE | Admit: 2022-04-06 | Payer: Medicaid Other | Source: Ambulatory Visit

## 2022-04-19 ENCOUNTER — Ambulatory Visit
Admission: RE | Admit: 2022-04-19 | Discharge: 2022-04-19 | Disposition: A | Discharge status: Home / Self Care | Payer: Medicaid Other | Source: Ambulatory Visit | Attending: Medical | Admitting: Medical

## 2022-04-19 DIAGNOSIS — N926 Irregular menstruation, unspecified: Secondary | ICD-10-CM

## 2022-04-19 DIAGNOSIS — K6389 Other specified diseases of intestine: Secondary | ICD-10-CM | POA: Insufficient documentation

## 2022-04-19 MED ORDER — IOHEXOL 300 MG/ML  SOLN
100.0000 mL | Freq: Once | INTRAMUSCULAR | Status: AC | PRN
  Administered 2022-04-19: 100 mL via INTRAVENOUS

## 2022-06-26 IMAGING — CR DG CHEST 2V
1 series · 2 of 2 positions shown · non-contrast
Comparison: None.

CLINICAL DATA: Chest pain radiating to the back.

EXAM:
CHEST - 2 VIEW

[Series 1: dg chest 2 view · 0.14mm/px · 2 of 2 slices shown]
[im 1/2]
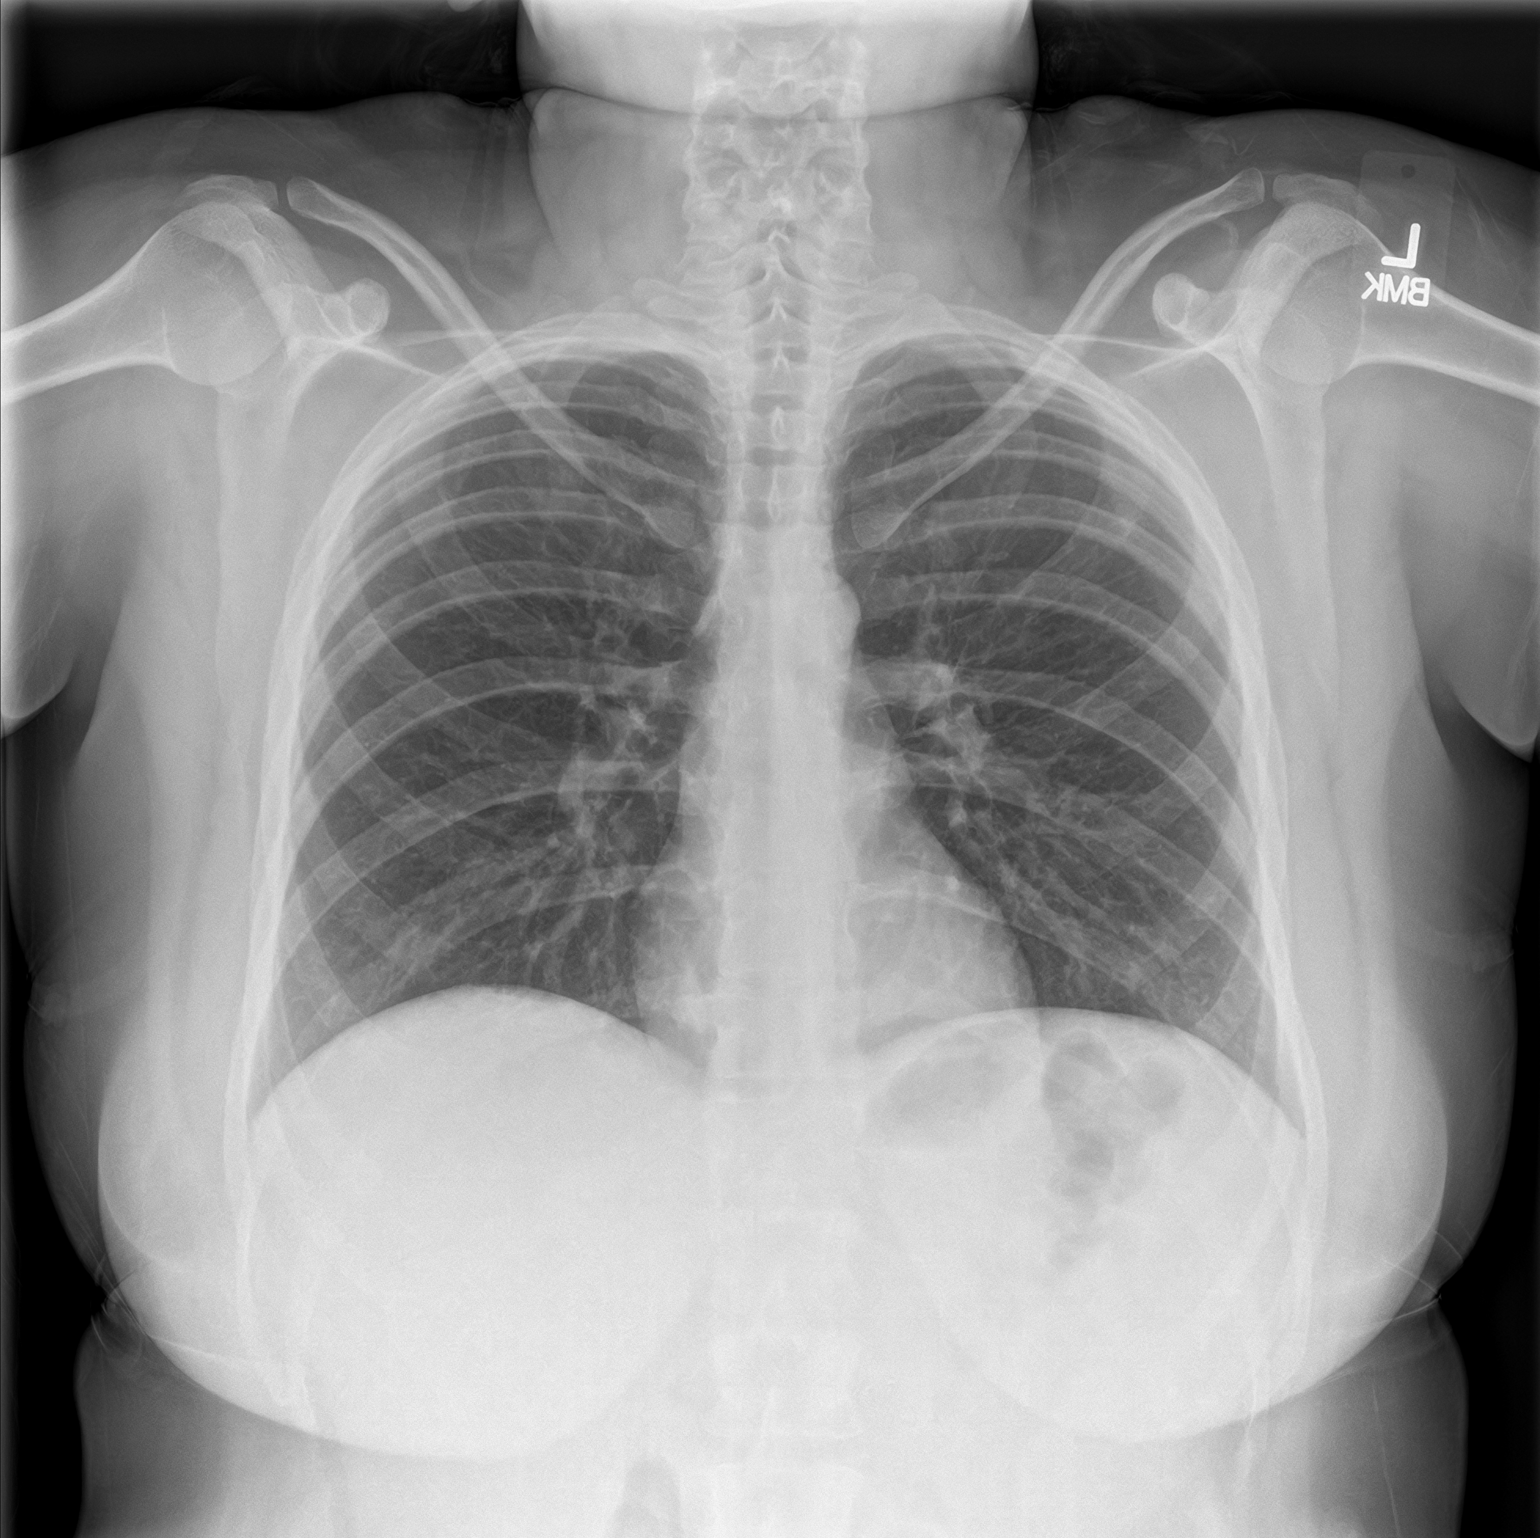
[im 2/2]
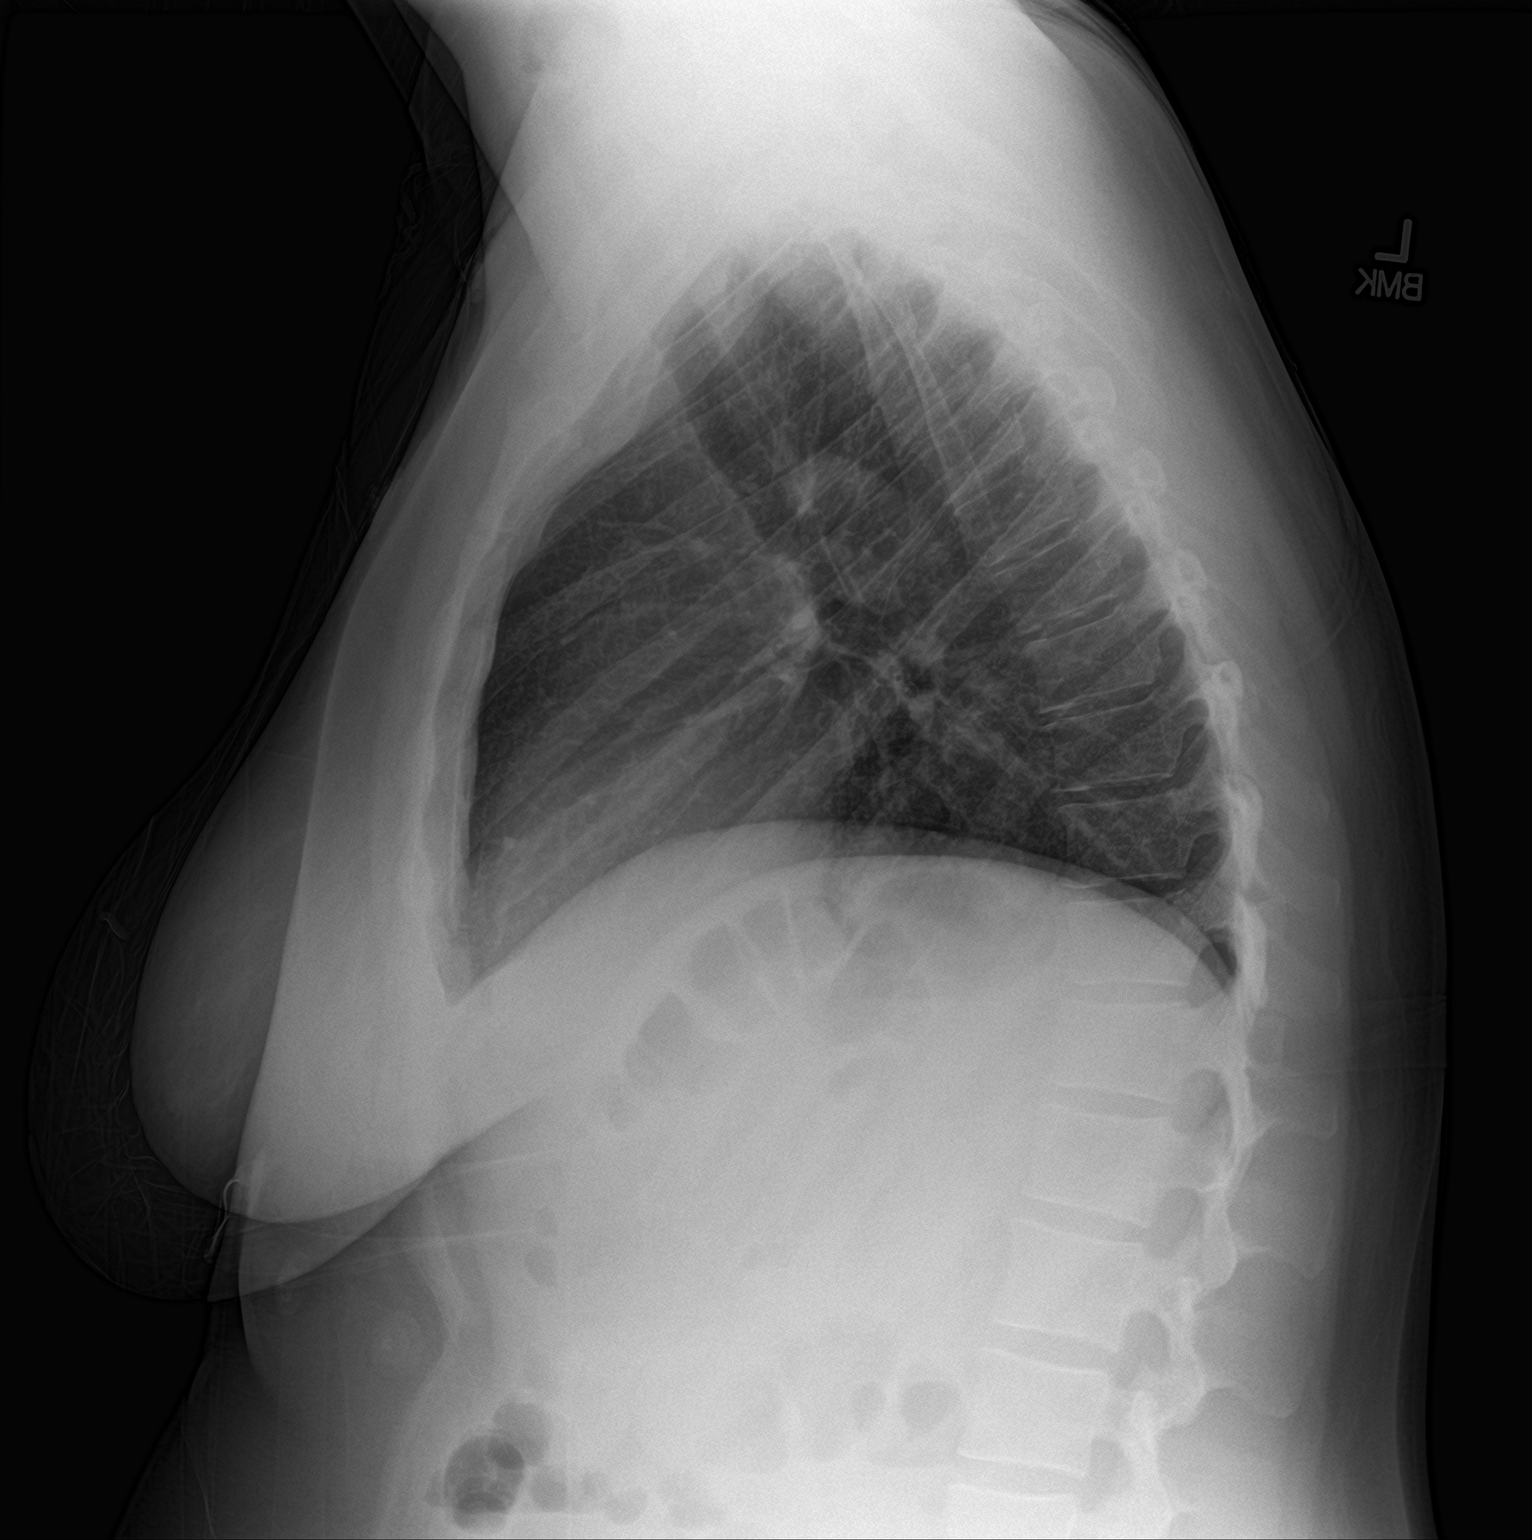

[2 of 2 positions shown; findings below may reference images not displayed]

FINDINGS: The heart size and mediastinal contours are within normal limits.
Both lungs are clear. The visualized skeletal structures are
unremarkable.
IMPRESSION: No active cardiopulmonary disease.
